# Patient Record
Sex: Female | Born: 1990 | Hispanic: No | Marital: Single | Smoking: Current every day smoker
Health system: Southern US, Community
[De-identification: ages and names within clinical notes are randomized; demographics above are authoritative.]

## PROBLEM LIST (undated history)

## (undated) DIAGNOSIS — K529 Noninfective gastroenteritis and colitis, unspecified: Secondary | ICD-10-CM

## (undated) HISTORY — PX: SPLENECTOMY, TOTAL: SHX788

---

## 2020-02-13 ENCOUNTER — Emergency Department (HOSPITAL_COMMUNITY)
Admission: EM | Admit: 2020-02-13 | Discharge: 2020-02-13 | Disposition: A | Discharge status: Home / Self Care | Payer: Medicaid Other | Attending: Emergency Medicine | Admitting: Emergency Medicine

## 2020-02-13 ENCOUNTER — Other Ambulatory Visit: Payer: Self-pay

## 2020-02-13 ENCOUNTER — Encounter (HOSPITAL_COMMUNITY): Payer: Self-pay | Admitting: Emergency Medicine

## 2020-02-13 DIAGNOSIS — R112 Nausea with vomiting, unspecified: Secondary | ICD-10-CM | POA: Insufficient documentation

## 2020-02-13 DIAGNOSIS — R197 Diarrhea, unspecified: Secondary | ICD-10-CM | POA: Insufficient documentation

## 2020-02-13 DIAGNOSIS — F17200 Nicotine dependence, unspecified, uncomplicated: Secondary | ICD-10-CM | POA: Insufficient documentation

## 2020-02-13 DIAGNOSIS — R509 Fever, unspecified: Secondary | ICD-10-CM | POA: Diagnosis not present

## 2020-02-13 DIAGNOSIS — R300 Dysuria: Secondary | ICD-10-CM | POA: Diagnosis present

## 2020-02-13 DIAGNOSIS — N898 Other specified noninflammatory disorders of vagina: Secondary | ICD-10-CM | POA: Diagnosis not present

## 2020-02-13 DIAGNOSIS — R109 Unspecified abdominal pain: Secondary | ICD-10-CM | POA: Insufficient documentation

## 2020-02-13 LAB — COMPREHENSIVE METABOLIC PANEL
ALT: 11 U/L (ref 0–44)
AST: 10 U/L — ABNORMAL LOW (ref 15–41)
Albumin: 4.2 g/dL (ref 3.5–5.0)
Alkaline Phosphatase: 77 U/L (ref 38–126)
Anion gap: 10 (ref 5–15)
BUN: 7 mg/dL (ref 6–20)
CO2: 24 mmol/L (ref 22–32)
Calcium: 9.5 mg/dL (ref 8.9–10.3)
Chloride: 106 mmol/L (ref 98–111)
Creatinine, Ser: 0.77 mg/dL (ref 0.44–1.00)
GFR calc Af Amer: >60 mL/min (ref >60)
GFR calc non Af Amer: >60 mL/min (ref >60)
Glucose, Bld: 88 mg/dL (ref 70–99)
Potassium: 3.8 mmol/L (ref 3.5–5.1)
Sodium: 140 mmol/L (ref 135–145)
Total Bilirubin: 0.5 mg/dL (ref 0.3–1.2)
Total Protein: 7.2 g/dL (ref 6.5–8.1)

## 2020-02-13 LAB — CBC
HCT: 46.3 % — ABNORMAL HIGH (ref 36.0–46.0)
Hemoglobin: 15.4 g/dL — ABNORMAL HIGH (ref 12.0–15.0)
MCH: 30.5 pg (ref 26.0–34.0)
MCHC: 33.3 g/dL (ref 30.0–36.0)
MCV: 91.7 fL (ref 80.0–100.0)
Platelets: 197 10*3/uL (ref 150–400)
RBC: 5.05 MIL/uL (ref 3.87–5.11)
RDW: 12.9 % (ref 11.5–15.5)
WBC: 8.5 10*3/uL (ref 4.0–10.5)
nRBC: 0 % (ref 0.0–0.2)

## 2020-02-13 LAB — LIPASE, BLOOD: Lipase: 17 U/L (ref 11–51)

## 2020-02-13 LAB — HIV ANTIBODY (ROUTINE TESTING W REFLEX): HIV Screen 4th Generation wRfx: NONREACTIVE

## 2020-02-13 MED ORDER — ONDANSETRON HCL 4 MG/2ML IJ SOLN
INTRAMUSCULAR | Status: AC
  Filled 2020-02-13: qty 2

## 2020-02-13 MED ORDER — SODIUM CHLORIDE 0.9 % IV BOLUS
1000.0000 mL | Freq: Once | INTRAVENOUS | Status: AC
  Administered 2020-02-13: 1000 mL via INTRAVENOUS

## 2020-02-13 MED ORDER — DICYCLOMINE HCL 10 MG/ML IM SOLN
20.0000 mg | Freq: Once | INTRAMUSCULAR | Status: DC
  Filled 2020-02-13: qty 2

## 2020-02-13 MED ORDER — KETOROLAC TROMETHAMINE 30 MG/ML IJ SOLN
30.0000 mg | Freq: Once | INTRAMUSCULAR | Status: AC
  Administered 2020-02-13: 30 mg via INTRAVENOUS
  Filled 2020-02-13: qty 1

## 2020-02-13 MED ORDER — METRONIDAZOLE 500 MG PO TABS
500.0000 mg | ORAL_TABLET | Freq: Two times a day (BID) | ORAL | 0 refills | Status: AC
Start: 2020-02-13 — End: 2020-02-20

## 2020-02-13 MED ORDER — SODIUM CHLORIDE 0.9% FLUSH: 3.0000 mL | Freq: Once | INTRAVENOUS | Status: DC

## 2020-02-13 MED ORDER — METOCLOPRAMIDE HCL 5 MG/ML IJ SOLN
10.0000 mg | Freq: Once | INTRAMUSCULAR | Status: AC
  Administered 2020-02-13: 10 mg via INTRAVENOUS
  Filled 2020-02-13: qty 2

## 2020-02-13 NOTE — ED Notes (Addendum)
PT screaming because "nothing is being done" for pt. Pt states "I would rather go lay in my bed and cry than be in here. They don't like me and that is why I can't get medicine." Pt asked to speak with "a real doctor". Notified Cortni, PA.   Pt removed IV and ran out of room cursing at staff stating "fuck this hospital". Family member asked for prescription to be called in for pt as pt left ER. Cortni, PA, aware.

## 2020-02-13 NOTE — ED Triage Notes (Signed)
Pt was told she has an STD and needs treatment also c/o of dysuria, n/v/d, diarrhea

## 2020-02-13 NOTE — ED Provider Notes (Cosign Needed)
[facility] EMERGENCY DEPARTMENT Provider Note   CSN: [id number] Arrival date & time: 02/13/20  1515     History Chief Complaint  Patient presents with  . Dysuria    Virginia Lynch is a 29 y.o. female.  HPI   30 year old female presenting for evaluation of left-sided abdominal pain.  States this has been ongoing for at least the last week.  She reports associated nausea, vomiting, diarrhea but states that she has been having black stools for several days.  She also reports subjective fevers at home, dysuria and vaginal discharge.  She states that she was diagnosed with trichomonas but has not been given a prescription for this so she came here for antibiotics.  She is also requesting repeat STD testing.    History reviewed. No pertinent past medical history.  There are no problems to display for this patient.   History reviewed. No pertinent surgical history.   OB History    Gravida  3   Para      Term      Preterm      AB      Living  3     SAB      TAB      Ectopic      Multiple      Live Births              No family history on file.  Social History   Tobacco Use  . Smoking status: Current Every Day Smoker    Packs/day: 1.00  . Smokeless tobacco: Never Used  Substance Use Topics  . Alcohol use: Never  . Drug use: Yes    Types: Marijuana    Home Medications Prior to Admission medications   Medication Sig Start Date End Date Taking? Authorizing Provider  metroNIDAZOLE (FLAGYL) 500 MG tablet Take 1 tablet (500 mg total) by mouth 2 (two) times daily for 7 days. 02/13/20 02/20/20  Dallys Nowakowski S, PA-C    Allergies    Patient has no allergy information on record.  Review of Systems   Review of Systems  Constitutional: Positive for fever.  HENT: Negative for ear pain and sore throat.   Eyes: Negative for visual disturbance.  Respiratory: Negative for cough and shortness of breath.   Cardiovascular: Negative for chest pain.    Gastrointestinal: Positive for abdominal pain, diarrhea, nausea and vomiting.  Genitourinary: Positive for dysuria, pelvic pain and vaginal discharge. Negative for hematuria.  Musculoskeletal: Negative for back pain.  Skin: Negative for rash.  Neurological: Negative for headaches.  All other systems reviewed and are negative.   Physical Exam Updated Vital Signs BP 128/85 (BP Location: Left Arm)   Pulse 70   Temp 98 F (36.7 C) (Oral)   Ht 5\' 7"  (1.702 m)   Wt 83.9 kg   SpO2 98%   BMI 28.98 kg/m   Physical Exam Vitals and nursing note reviewed.  Constitutional:      General: She is not in acute distress.    Appearance: She is well-developed.  HENT:     Head: Normocephalic and atraumatic.  Eyes:     Conjunctiva/sclera: Conjunctivae normal.  Cardiovascular:     Rate and Rhythm: Normal rate and regular rhythm.     Pulses: Normal pulses.     Heart sounds: Normal heart sounds. No murmur heard.   Pulmonary:     Effort: Pulmonary effort is normal. No respiratory distress.     Breath sounds: Normal breath sounds. No wheezing,  rhonchi or rales.  Abdominal:     General: Bowel sounds are normal.     Palpations: Abdomen is soft.     Tenderness: There is abdominal tenderness. There is no guarding or rebound.     Comments: LLQ TTP but distractible on exam  Genitourinary:    Comments: Pt eloped prior to pelvic exam Musculoskeletal:     Cervical back: Neck supple.  Skin:    General: Skin is warm and dry.  Neurological:     Mental Status: She is alert.     ED Results / Procedures / Treatments   Labs (all labs ordered are listed, but only abnormal results are displayed) Labs Reviewed  COMPREHENSIVE METABOLIC PANEL - Abnormal; Notable for the following components:      Result Value   AST 10 (*)    All other components within normal limits  CBC - Abnormal; Notable for the following components:   Hemoglobin 15.4 (*)    HCT 46.3 (*)    All other components within normal  limits  WET PREP, GENITAL  LIPASE, BLOOD  URINALYSIS, ROUTINE W REFLEX MICROSCOPIC  HIV ANTIBODY (ROUTINE TESTING W REFLEX)  RPR  GC/CHLAMYDIA PROBE AMP ([place]) NOT AT [facility]    EKG None  Radiology No results found.  Procedures Procedures (including critical care time)  Medications Ordered in ED Medications  sodium chloride flush (NS) 0.9 % injection 3 mL (0 mLs Intravenous Hold 02/13/20 1621)  ondansetron (ZOFRAN) 4 MG/2ML injection (has no administration in time range)  sodium chloride 0.9 % bolus 1,000 mL (1,000 mLs Intravenous New Bag/Given 02/13/20 1742)  ketorolac (TORADOL) 30 MG/ML injection 30 mg (30 mg Intravenous Given 02/13/20 1743)  metoCLOPramide (REGLAN) injection 10 mg (10 mg Intravenous Given 02/13/20 1743)  dicyclomine (BENTYL) injection 20 mg (20 mg Intramuscular Given 02/13/20 1743)    ED Course  I have reviewed the triage vital signs and the nursing notes.  Pertinent labs & imaging results that were available during my care of the patient were reviewed by me and considered in my medical decision making (see chart for details).    MDM Rules/Calculators/A&P                         29 year old female presenting for evaluation of nausea, vomiting, diarrhea, vaginal discharge and urinary symptoms.  Had positive test for trichomonas at urgent care several days ago.  She is here stating that she never received antibiotics and is requesting a prescription.  I initiated work-up with labs, UA, pregnancy test.  I discussed plan with patient to obtain labs, pelvic exam.  She is also requesting repeat STD testing.  I ordered IV fluids, Reglan, Toradol and Bentyl.  I reviewed records per care everywhere.  Patient was seen at an urgent care on 02/08/2020.  At that time she had STD testing and was noted to have trichomonas.  She was placed on doxycycline at that time.  It also appears that she was given an Rx for Flagyl on 02/12/2020.  She was seen again yesterday at [facility]  ED for evaluation of similar symptoms.  During this visit she had a very thorough work-up including complete labs, UA, CT abdomen/pelvis.  Results of CT abdomen/pelvis was notable for developing gastroenteritis, 2 cm right ovarian cyst, and bilateral pleural effusions.  Her UA showed leukocytes, 5-10 squamous cells, WBCs without any significant bacteria.  This suggests she may have STI rather than UTI.  Patient was advised to  continue taking her Flagyl and doxycycline and they also gave her Rx for Phenergan for home.  This note indicates that patient admitted to already starting the doxycycline and Flagyl.  Upon receiving Toradol, patient became very agitated and started raising her voice at staff stating that we do not like her which is why we did not give her narcotics.  She took out her IV and ambulated out of the department before I could have a discussion with her about leaving AMA.  Her mother is at bedside and is repeatedly asking for me to fill a prescription to treat her trichomonas.  I advised that it appeared patient had already received a prescription however mother is adamant that she has not received this.  I will call in Rx for Flagyl to treat her trichomonas.  Final Clinical Impression(s) / ED Diagnoses Final diagnoses:  Abdominal pain, unspecified abdominal location  Dysuria  Vaginal discharge    Rx / DC Orders ED Discharge Orders         Ordered    metroNIDAZOLE (FLAGYL) 500 MG tablet  2 times daily     Discontinue  Reprint     02/13/20 1759           Karrie Meres, PA-C 02/13/20 1808

## 2020-02-13 NOTE — ED Notes (Signed)
Pt refused treatment until mother allowed to be present in room, including pelvic exam. Mother accompanying pt in room at this time.  Per mother "The [place] ER gave her Keflex for the Trich and I know that is the wrong medication. She has been throwing up blood and peeing blood for a month. 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 Also she has an IUD that was supposed to come out in 2018 and she needs it out. The hospital put it in so y'all should be able to take it out".  Informed pt mother that the ER does not remove IUDs and advised to follow up with OBGYN for removal.

## 2020-02-14 LAB — SYPHILIS: RPR W/REFLEX TO RPR TITER AND TREPONEMAL ANTIBODIES, TRADITIONAL SCREENING AND DIAGNOSIS ALGORITHM: RPR Ser Ql: NONREACTIVE

## 2020-07-23 ENCOUNTER — Encounter (HOSPITAL_COMMUNITY): Payer: Self-pay

## 2020-07-23 ENCOUNTER — Observation Stay (HOSPITAL_COMMUNITY)
Admission: EM | Admit: 2020-07-23 | Discharge: 2020-07-23 | Disposition: A | Discharge status: Home / Self Care | Payer: Medicaid Other | Attending: Family Medicine | Admitting: Family Medicine

## 2020-07-23 ENCOUNTER — Other Ambulatory Visit: Payer: Self-pay

## 2020-07-23 ENCOUNTER — Emergency Department (HOSPITAL_COMMUNITY): Payer: Medicaid Other

## 2020-07-23 DIAGNOSIS — F12188 Cannabis abuse with other cannabis-induced disorder: Secondary | ICD-10-CM | POA: Diagnosis not present

## 2020-07-23 DIAGNOSIS — Z720 Tobacco use: Secondary | ICD-10-CM | POA: Diagnosis not present

## 2020-07-23 DIAGNOSIS — Z20822 Contact with and (suspected) exposure to covid-19: Secondary | ICD-10-CM | POA: Diagnosis not present

## 2020-07-23 DIAGNOSIS — F17210 Nicotine dependence, cigarettes, uncomplicated: Secondary | ICD-10-CM | POA: Diagnosis not present

## 2020-07-23 DIAGNOSIS — R1115 Cyclical vomiting syndrome unrelated to migraine: Secondary | ICD-10-CM | POA: Diagnosis not present

## 2020-07-23 DIAGNOSIS — R112 Nausea with vomiting, unspecified: Secondary | ICD-10-CM | POA: Diagnosis present

## 2020-07-23 DIAGNOSIS — Z79899 Other long term (current) drug therapy: Secondary | ICD-10-CM | POA: Insufficient documentation

## 2020-07-23 DIAGNOSIS — R1032 Left lower quadrant pain: Secondary | ICD-10-CM | POA: Diagnosis not present

## 2020-07-23 DIAGNOSIS — F1210 Cannabis abuse, uncomplicated: Secondary | ICD-10-CM | POA: Diagnosis not present

## 2020-07-23 HISTORY — DX: Noninfective gastroenteritis and colitis, unspecified: K52.9

## 2020-07-23 LAB — RAPID URINE DRUG SCREEN, HOSP PERFORMED
Amphetamines: NOT DETECTED
Barbiturates: NOT DETECTED
Benzodiazepines: NOT DETECTED
Cocaine: NOT DETECTED
Opiates: POSITIVE — AB
Tetrahydrocannabinol: POSITIVE — AB

## 2020-07-23 LAB — CBC
HCT: 44.8 % (ref 36.0–46.0)
Hemoglobin: 14.6 g/dL (ref 12.0–15.0)
MCH: 27.1 pg (ref 26.0–34.0)
MCHC: 32.6 g/dL (ref 30.0–36.0)
MCV: 83.1 fL (ref 80.0–100.0)
Platelets: 452 10*3/uL — ABNORMAL HIGH (ref 150–400)
RBC: 5.39 MIL/uL — ABNORMAL HIGH (ref 3.87–5.11)
RDW: 21.6 % — ABNORMAL HIGH (ref 11.5–15.5)
WBC: 21.9 10*3/uL — ABNORMAL HIGH (ref 4.0–10.5)
nRBC: 0 % (ref 0.0–0.2)

## 2020-07-23 LAB — URINALYSIS, ROUTINE W REFLEX MICROSCOPIC
Bilirubin Urine: NEGATIVE
Glucose, UA: NEGATIVE mg/dL
Ketones, ur: 5 mg/dL — AB
Leukocytes,Ua: NEGATIVE
Nitrite: NEGATIVE
Protein, ur: NEGATIVE mg/dL
Specific Gravity, Urine: 1.02 (ref 1.005–1.030)
pH: 6 (ref 5.0–8.0)

## 2020-07-23 LAB — COMPREHENSIVE METABOLIC PANEL
ALT: 12 U/L (ref 0–44)
AST: 10 U/L — ABNORMAL LOW (ref 15–41)
Albumin: 4 g/dL (ref 3.5–5.0)
Alkaline Phosphatase: 78 U/L (ref 38–126)
Anion gap: 12 (ref 5–15)
BUN: 12 mg/dL (ref 6–20)
CO2: 24 mmol/L (ref 22–32)
Calcium: 9.2 mg/dL (ref 8.9–10.3)
Chloride: 100 mmol/L (ref 98–111)
Creatinine, Ser: 0.73 mg/dL (ref 0.44–1.00)
GFR, Estimated: >60 mL/min (ref >60)
Glucose, Bld: 99 mg/dL (ref 70–99)
Potassium: 3.7 mmol/L (ref 3.5–5.1)
Sodium: 136 mmol/L (ref 135–145)
Total Bilirubin: 0.5 mg/dL (ref 0.3–1.2)
Total Protein: 7 g/dL (ref 6.5–8.1)

## 2020-07-23 LAB — POC URINE PREG, ED: Preg Test, Ur: NEGATIVE

## 2020-07-23 LAB — RESP PANEL BY RT-PCR (FLU A&B, COVID) ARPGX2
Influenza A by PCR: NEGATIVE
Influenza B by PCR: NEGATIVE
SARS Coronavirus 2 by RT PCR: NEGATIVE

## 2020-07-23 LAB — LIPASE, BLOOD: Lipase: 16 U/L (ref 11–51)

## 2020-07-23 MED ORDER — CAPSAICIN 0.025 % EX CREA
TOPICAL_CREAM | Freq: Two times a day (BID) | CUTANEOUS | Status: DC
  Filled 2020-07-23: qty 60

## 2020-07-23 MED ORDER — DEXTROSE-NACL 5-0.45 % IV SOLN: INTRAVENOUS | Status: DC

## 2020-07-23 MED ORDER — MORPHINE SULFATE (PF) 4 MG/ML IV SOLN
4.0000 mg | Freq: Once | INTRAVENOUS | Status: AC
  Administered 2020-07-23: 14:00:00 4 mg via INTRAVENOUS
  Filled 2020-07-23: qty 1

## 2020-07-23 MED ORDER — IOHEXOL 300 MG/ML  SOLN
100.0000 mL | Freq: Once | INTRAMUSCULAR | Status: AC | PRN
  Administered 2020-07-23: 15:00:00 100 mL via INTRAVENOUS

## 2020-07-23 MED ORDER — ONDANSETRON HCL 4 MG/2ML IJ SOLN
4.0000 mg | Freq: Once | INTRAMUSCULAR | Status: AC
  Administered 2020-07-23: 14:00:00 4 mg via INTRAVENOUS
  Filled 2020-07-23: qty 2

## 2020-07-23 MED ORDER — PROMETHAZINE HCL 25 MG/ML IJ SOLN
12.5000 mg | Freq: Once | INTRAMUSCULAR | Status: AC
  Administered 2020-07-23: 16:00:00 12.5 mg via INTRAVENOUS
  Filled 2020-07-23: qty 1

## 2020-07-23 MED ORDER — PANTOPRAZOLE SODIUM 40 MG IV SOLR
40.0000 mg | Freq: Once | INTRAVENOUS | Status: AC
  Administered 2020-07-23: 19:00:00 40 mg via INTRAVENOUS
  Filled 2020-07-23: qty 40

## 2020-07-23 MED ORDER — HYDROMORPHONE HCL 1 MG/ML IJ SOLN
1.0000 mg | Freq: Once | INTRAMUSCULAR | Status: AC
  Administered 2020-07-23: 16:00:00 1 mg via INTRAVENOUS
  Filled 2020-07-23: qty 1

## 2020-07-23 MED ORDER — SODIUM CHLORIDE 0.9 % IV BOLUS
1000.0000 mL | Freq: Once | INTRAVENOUS | Status: AC
  Administered 2020-07-23: 14:00:00 1000 mL via INTRAVENOUS

## 2020-07-23 MED ORDER — PANTOPRAZOLE SODIUM 20 MG PO TBEC: 20.0000 mg | DELAYED_RELEASE_TABLET | Freq: Every day | ORAL | 0 refills | Status: AC

## 2020-07-23 MED ORDER — SODIUM CHLORIDE 0.9 % IV BOLUS
1000.0000 mL | Freq: Once | INTRAVENOUS | Status: AC
  Administered 2020-07-23: 18:00:00 1000 mL via INTRAVENOUS

## 2020-07-23 NOTE — Consult Note (Signed)
Patient Demographics:    Virginia Lynch, is a 29 y.o. 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 ([place])   Marijuana abuse   Emesis, persistent   Tobacco abuse  1)Cannabis Hyperemesis Syndrome--- patient admits to smoking about $30-$40 worth of marijuana on a daily basis--- --patient was seen at [place] on 07/20/2020 with similar complaints, her work-up including CT abdomen and pelvis were unremarkable at that time -Patient history reveals persistent leukocytosis, WBC slightly higher than the usual baseline today--however chest x-ray, UA, and CT abdomen and pelvis without acute findings -Please note that patient has been afebrile for at least 72 hours or so -Out of abundance of caution blood cultures have been requested -I suspect patient's leukocytosis is reactive from excessive dry heaving/retching and emesis induced by cannabis hyperemesis syndrome -Given extensive work-up at [facility] health on 07/20/2020, and extensive work-up here today without significant acute findings patient will be discharged from the ED after IV fluids, PPI and antiemetics -Lipase is 16 =-Covid is negative, urine pregnancy test is negative, -UDS positive for opiates and THC -Please note that prior UDS done at [facility] health over the summer were repeatedly positive for opiates and THC as well  2)Tobacco and cannabis abuse------ abstinence strongly advised, patient not ready to quit either tobacco or cannabis at this time   Disposition--- okay to discharge home   With History of - Reviewed by me  Past Medical History:  Diagnosis  Date  . Colitis      Past Surgical History:  Procedure Laterality Date  . CESAREAN SECTION    . SPLENECTOMY, TOTAL      Chief Complaint  Patient presents with  . Emesis     HPI:    Virginia Lynch  is a 29 y.o. female who is a daily tobacco and marijuana user, status post prior splenectomy with history of prior colitis presents to the ED with almost a week of nausea vomiting and abdominal pain with occasional loose stools -Mostly dry heaving at this time, tried oral antiemetics at home with little success -Stools without blood or mucus -Emesis without blood or bile  --Review of care everywhere shows that patient was seen at [place] on 07/20/2020 with extensive work-up including UDS lab work and CT abdomen and pelvis which were largely unremarkable except for UDS showing THC and opiates -- patient admits to smoking about $30-$40 worth of marijuana on a daily basis--- --patient was seen at [place] on 07/20/2020 with similar complaints, her work-up including CT abdomen and pelvis were unremarkable at that time -Patient history reveals persistent leukocytosis, WBC slightly higher than the usual baseline today--however chest x-ray, UA, and CT abdomen and pelvis without acute findings -Please note that  patient has been afebrile for at least 72 hours or so -Out of abundance of caution blood cultures have been requested -I suspect patient's leukocytosis is reactive from excessive dry heaving/retching and emesis induced by cannabis hyperemesis syndrome -Given extensive work-up at [facility] on 07/20/2020, and extensive work-up here today without significant acute findings patient will be discharged from the ED after IV fluids, PPI and antiemetics -Lipase is 16 =-Covid is negative, urine pregnancy test is negative, -UDS positive for opiates and THC -Please note that prior UDS done at [facility] over the summer were repeatedly positive for opiates and THC as well -- ---LFTs  are not elevated, CMP essentially WNL, with a creatinine of 0.73 -CBC with a white count of 21K  -Chest x-ray without acute finding   Review of systems:    In addition to the HPI above,   A full Review of  Systems was done, all other systems reviewed are negative except as noted above in HPI , .    Social History:  Reviewed by me    Social History   Tobacco Use  . Smoking status: Current Every Day Smoker    Packs/day: 1.00  . Smokeless tobacco: Never Used  Substance Use Topics  . Alcohol use: Never       Family History :  Reviewed by me   History reviewed. No pertinent family history.   Home Medications:   Prior to Admission medications   Medication Sig Start Date End Date Taking? Authorizing Provider  dicyclomine (BENTYL) 10 MG capsule Take by mouth. 12/07/18  Yes [provider]  famotidine (PEPCID) 20 MG tablet Take by mouth. 07/20/20 07/30/20 Yes [provider]  magnesium hydroxide (MILK OF MAGNESIA) 400 MG/5ML suspension Take by mouth. 03/19/20  Yes [provider]  methocarbamol (ROBAXIN) 500 MG tablet Take by mouth. 03/09/20  Yes [provider]  metroNIDAZOLE (FLAGYL) 500 MG tablet Take by mouth. 02/12/20  Yes [provider]  naloxone (NARCAN) nasal spray 4 mg/0.1 mL Place into the nose. 03/19/20  Yes [provider]  nicotine (NICODERM CQ - DOSED IN MG/24 HOURS) 21 mg/24hr patch Place onto the skin. 03/19/20  Yes [provider]  ondansetron (ZOFRAN-ODT) 4 MG disintegrating tablet Take by mouth. 07/20/20  Yes [provider]  pantoprazole (PROTONIX) 20 MG tablet  10/24/19  Yes [provider]  albuterol (VENTOLIN HFA) 108 (90 Base) MCG/ACT inhaler every 6 (six) hours.    [provider]  calcium carbonate (OS-CAL) 1250 (500 Ca) MG chewable tablet Chew by mouth.    [provider]  gabapentin (NEURONTIN) 100 MG capsule Take by mouth.    [provider]   levonorgestrel (MIRENA) 20 MCG/24HR IUD by Intrauterine route.    [provider]  SUPREP BOWEL PREP KIT 17.5-3.13-1.6 GM/177ML SOLN Take by mouth. 02/27/20   [provider]     Allergies:     Allergies  Allergen Reactions  . Toradol [Ketorolac Tromethamine]      Physical Exam:   Vitals  Blood pressure 103/64, pulse 68, temperature 98.4 F (36.9 C), temperature source Oral, resp. rate 15, height _0  (1.727 m), weight 86.2 kg, SpO2 100 %.  Physical Examination: General appearance - alert, well appearing, and in no distress  Mental status - alert, oriented to person, place, and time,  Eyes - sclera anicteric Neck - supple, no JVD elevation , Chest - clear  to auscultation bilaterally, symmetrical air movement,  Heart - S1 and S2  normal, regular  Abdomen - soft, left upper quadrant and epigastric discomfort without rebound or guarding, nondistended, no CVA area tenderness Neurological - screening mental status exam normal, neck supple without rigidity, cranial nerves II through XII intact, DTR's normal and symmetric Extremities - no pedal edema noted, intact peripheral pulses  Skin - warm, dry     Data Review:    CBC Recent Labs  Lab 07/23/20 1104  WBC 21.9*  HGB 14.6  HCT 44.8  PLT 452*  MCV 83.1  MCH 27.1  MCHC 32.6  RDW 21.6*   ------------------------------------------------------------------------------------------------------------------  Chemistries  Recent Labs  Lab 07/23/20 1104  NA 136  K 3.7  CL 100  CO2 24  GLUCOSE 99  BUN 12  CREATININE 0.73  CALCIUM 9.2  AST 10*  ALT 12  ALKPHOS 78  BILITOT 0.5   ------------------------------------------------------------------------------------------------------------------ estimated creatinine clearance is 119.2 mL/min (by C-G formula based on SCr of 0.73 mg/dL). ------------------------------------------------------------------------------------------------------------------ No  results for input(s): TSH, T4TOTAL, T3FREE, THYROIDAB in the last 72 hours.  Invalid input(s): FREET3   Coagulation profile No results for input(s): INR, PROTIME in the last 168 hours. ------------------------------------------------------------------------------------------------------------------- No results for input(s): DDIMER in the last 72 hours. -------------------------------------------------------------------------------------------------------------------  Cardiac Enzymes No results for input(s): CKMB, TROPONINI, MYOGLOBIN in the last 168 hours.  Invalid input(s): CK ------------------------------------------------------------------------------------------------------------------ No results found for: BNP   ---------------------------------------------------------------------------------------------------------------  Urinalysis    Component Value Date/Time   COLORURINE YELLOW 07/23/2020 1257   APPEARANCEUR CLOUDY (A) 07/23/2020 1257   LABSPEC 1.020 07/23/2020 1257   PHURINE 6.0 07/23/2020 1257   GLUCOSEU NEGATIVE 07/23/2020 1257   HGBUR SMALL (A) 07/23/2020 1257   BILIRUBINUR NEGATIVE 07/23/2020 1257   KETONESUR 5 (A) 07/23/2020 1257   PROTEINUR NEGATIVE 07/23/2020 1257   NITRITE NEGATIVE 07/23/2020 1257   LEUKOCYTESUR NEGATIVE 07/23/2020 1257     Imaging Results:    DG Chest 2 View  Result Date: 07/23/2020 CLINICAL DATA:  Emesis since 07/17/2020, abdominal cramping, diarrhea EXAM: CHEST - 2 VIEW COMPARISON:  None. FINDINGS: The heart size and mediastinal contours are within normal limits. Both lungs are clear. The visualized skeletal structures are unremarkable. IMPRESSION: No active cardiopulmonary disease. Electronically Signed   By: Randa Ngo M.D.   On: 07/23/2020 16:10   CT Abdomen Pelvis W Contrast  Result Date: 07/23/2020 CLINICAL DATA:  Left upper quadrant abdominal pain, nausea/vomiting, diarrhea EXAM: CT ABDOMEN AND PELVIS WITH CONTRAST  TECHNIQUE: Multidetector CT imaging of the abdomen and pelvis was performed using the standard protocol following bolus administration of intravenous contrast. CONTRAST:  1104m OMNIPAQUE IOHEXOL 300 MG/ML  SOLN COMPARISON:  None. FINDINGS: Lower chest: Lung bases are clear. Hepatobiliary: Liver is within normal limits. Gallbladder is unremarkable. No intrahepatic or extrahepatic ductal dilatation. Pancreas: Within normal limits. Spleen: Surgically absent. Adrenals/Urinary Tract: Adrenal glands are within normal limits. Kidneys are within normal limits.  No hydronephrosis. Bladder is within normal limits. Stomach/Bowel: Stomach is within normal limits. No evidence of bowel obstruction. Normal appendix (coronal image 49). No colonic wall thickening or inflammatory changes. Left colon is decompressed. Vascular/Lymphatic: No evidence of abdominal aortic aneurysm. No suspicious abdominopelvic lymphadenopathy. Reproductive: Uterus is notable for an IUD in satisfactory position. Left ovary is within normal limits. Suspected right corpus luteum (series 2/image 35), physiologic. Other: No abdominopelvic ascites. Musculoskeletal: Very mild degenerative changes of the lower thoracic spine and at L5-S1. IMPRESSION: Status post splenectomy. IUD in satisfactory position. No CT findings to account for the patient's left upper quadrant abdominal pain. Electronically  Signed   By: Julian Hy M.D.   On: 07/23/2020 14:56   Radiological Exams on Admission: DG Chest 2 View  Result Date: 07/23/2020 CLINICAL DATA:  Emesis since 07/17/2020, abdominal cramping, diarrhea EXAM: CHEST - 2 VIEW COMPARISON:  None. FINDINGS: The heart size and mediastinal contours are within normal limits. Both lungs are clear. The visualized skeletal structures are unremarkable. IMPRESSION: No active cardiopulmonary disease. Electronically Signed   By: Randa Ngo M.D.   On: 07/23/2020 16:10   CT Abdomen Pelvis W Contrast  Result Date:  07/23/2020 CLINICAL DATA:  Left upper quadrant abdominal pain, nausea/vomiting, diarrhea EXAM: CT ABDOMEN AND PELVIS WITH CONTRAST TECHNIQUE: Multidetector CT imaging of the abdomen and pelvis was performed using the standard protocol following bolus administration of intravenous contrast. CONTRAST:  164m OMNIPAQUE IOHEXOL 300 MG/ML  SOLN COMPARISON:  None. FINDINGS: Lower chest: Lung bases are clear. Hepatobiliary: Liver is within normal limits. Gallbladder is unremarkable. No intrahepatic or extrahepatic ductal dilatation. Pancreas: Within normal limits. Spleen: Surgically absent. Adrenals/Urinary Tract: Adrenal glands are within normal limits. Kidneys are within normal limits.  No hydronephrosis. Bladder is within normal limits. Stomach/Bowel: Stomach is within normal limits. No evidence of bowel obstruction. Normal appendix (coronal image 49). No colonic wall thickening or inflammatory changes. Left colon is decompressed. Vascular/Lymphatic: No evidence of abdominal aortic aneurysm. No suspicious abdominopelvic lymphadenopathy. Reproductive: Uterus is notable for an IUD in satisfactory position. Left ovary is within normal limits. Suspected right corpus luteum (series 2/image 35), physiologic. Other: No abdominopelvic ascites. Musculoskeletal: Very mild degenerative changes of the lower thoracic spine and at L5-S1. IMPRESSION: Status post splenectomy. IUD in satisfactory position. No CT findings to account for the patient's left upper quadrant abdominal pain. Electronically Signed   By: SJulian HyM.D.   On: 07/23/2020 14:56   Family Communication: Admission, patients condition and plan of care including tests being ordered have been discussed with the patient  who indicate understanding and agree with the plan   Code Status - Full Code  Condition   stable  CRoxan HockeyM.D on 07/23/2020 at 7:44 PM Go to [email or web address] -  for contact info  [facility] - Office  [phone]

## 2020-07-23 NOTE — ED Triage Notes (Signed)
Pt presents to ED with emesis since 12/23. Pt reports abdominal cramping. Pt also reports diarrhea.

## 2020-07-23 NOTE — ED Notes (Signed)
POC preg negative.

## 2020-07-23 NOTE — ED Notes (Signed)
Reports unable to void at this time

## 2020-07-23 NOTE — ED Provider Notes (Signed)
[facility] EMERGENCY DEPARTMENT Provider Note   CSN: [id number] Arrival date & time: 07/23/20  7290     History Chief Complaint  Patient presents with  . Emesis    Virginia Lynch is a 29 y.o. female with past medical history of colitis.  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  HPI Patient presents to emergency department today with chief complaint of emesis x6 days.  She states her symptoms have progressively worsened.  She admits to throwing up every hour.  She states she has tried taking Phenergan and Zofran without symptom improvement. Unable to tolerate PO intake. She is endorsing left upper quadrant abdominal pain.  She states it radiates throughout her stomach when she vomits.  She describes pain as sharp and burning. She states her emesis is non bloody, states today it was yellow in color.  She is also endorsing diarrhea.  She states stool is yellow liquid.  She has had 3 episodes of diarrhea in the last 24 hours. She is also reporting tmax of 102 x 3 days ago as well as chills. She denies any alcohol consumption.  She admits to smoking marijuana on special occasions, last was 12/13. She admits to eating [employer] prior to symptom onset. No sick contacts or known covid exposures. She denies congestion, cough, shortness of breath, chest pain, gross hematuria, urinary frequency, pelvic pain, abnormal vaginal bleeding, vaginal discharge, back pain.  Past Medical History:  Diagnosis Date  . Colitis     There are no problems to display for this patient.   Past Surgical History:  Procedure Laterality Date  . CESAREAN SECTION    . SPLENECTOMY, TOTAL       OB History    Gravida  3   Para      Term      Preterm      AB      Living  3     SAB      IAB      Ectopic      Multiple      Live Births              No family history on file.  Social History   Tobacco Use  . Smoking status: Current  Every Day Smoker    Packs/day: 1.00  . Smokeless tobacco: Never Used  Substance Use Topics  . Alcohol use: Never  . Drug use: Yes    Types: Marijuana    Home Medications Prior to Admission medications   Medication Sig Start Date End Date Taking? Authorizing Provider  dicyclomine (BENTYL) 10 MG capsule Take by mouth. 12/07/18  Yes [provider]  famotidine (PEPCID) 20 MG tablet Take by mouth. 07/20/20 07/30/20 Yes [provider]  magnesium hydroxide (MILK OF MAGNESIA) 400 MG/5ML suspension Take by mouth. 03/19/20  Yes [provider]  methocarbamol (ROBAXIN) 500 MG tablet Take by mouth. 03/09/20  Yes [provider]  metroNIDAZOLE (FLAGYL) 500 MG tablet Take by mouth. 02/12/20  Yes [provider]  naloxone (NARCAN) nasal spray 4 mg/0.1 mL Place into the nose. 03/19/20  Yes [provider]  nicotine (NICODERM CQ - DOSED IN MG/24 HOURS) 21 mg/24hr patch Place onto the skin. 03/19/20  Yes [provider]  ondansetron (ZOFRAN-ODT) 4 MG disintegrating tablet Take by mouth. 07/20/20  Yes [provider]  pantoprazole (PROTONIX) 20 MG tablet  10/24/19  Yes [provider]  albuterol (VENTOLIN HFA) 108 (90 Base)  MCG/ACT inhaler every 6 (six) hours.    [provider]  calcium carbonate (OS-CAL) 1250 (500 Ca) MG chewable tablet Chew by mouth.    [provider]  gabapentin (NEURONTIN) 100 MG capsule Take by mouth.    [provider]  levonorgestrel (MIRENA) 20 MCG/24HR IUD by Intrauterine route.    [provider]  SUPREP BOWEL PREP KIT 17.5-3.13-1.6 GM/177ML SOLN Take by mouth. 02/27/20   [provider]    Allergies    Toradol [ketorolac tromethamine]  Review of Systems   Review of Systems All other systems are reviewed and are negative for acute change except as noted in the HPI.  Physical Exam Updated Vital Signs BP 128/86 (BP Location: Right Arm)   Pulse 60   Temp  98 F (36.7 C) (Oral)   Resp 18   Ht '5\' 8"'  (1.727 m)   Wt 86.2 kg   SpO2 100%   BMI 28.89 kg/m   Physical Exam Vitals and nursing note reviewed.  Constitutional:      General: She is not in acute distress.    Appearance: She is ill-appearing. She is not toxic-appearing.  HENT:     Head: Normocephalic and atraumatic.     Right Ear: Tympanic membrane and external ear normal.     Left Ear: Tympanic membrane and external ear normal.     Nose: Nose normal.     Mouth/Throat:     Mouth: Mucous membranes are moist.     Pharynx: Oropharynx is clear.  Eyes:     General: No scleral icterus.       Right eye: No discharge.        Left eye: No discharge.     Extraocular Movements: Extraocular movements intact.     Conjunctiva/sclera: Conjunctivae normal.     Pupils: Pupils are equal, round, and reactive to light.  Neck:     Vascular: No JVD.  Cardiovascular:     Rate and Rhythm: Normal rate and regular rhythm.     Pulses: Normal pulses.          Radial pulses are 2+ on the right side and 2+ on the left side.     Heart sounds: Normal heart sounds.  Pulmonary:     Comments: Lungs clear to auscultation in all fields. Symmetric chest rise. No wheezing, rales, or rhonchi. Abdominal:     Tenderness: There is abdominal tenderness in the right upper quadrant, epigastric area and left upper quadrant. There is no right CVA tenderness or left CVA tenderness.     Comments: Abdomen is soft, non-distended. No rigidity, no guarding. No peritoneal signs.  Musculoskeletal:        General: Normal range of motion.     Cervical back: Normal range of motion.  Skin:    General: Skin is warm and dry.     Capillary Refill: Capillary refill takes less than 2 seconds.     Comments: No track marks seen on extremities  Neurological:     Mental Status: She is oriented to person, place, and time.     GCS: GCS eye subscore is 4. GCS verbal subscore is 5. GCS motor subscore is 6.     Comments: Fluent speech, no  facial droop.  Psychiatric:        Behavior: Behavior normal.     ED Results / Procedures / Treatments   Labs (all labs ordered are listed, but only abnormal results are displayed) Labs Reviewed  COMPREHENSIVE METABOLIC PANEL -  Abnormal; Notable for the following components:      Result Value   AST 10 (*)    All other components within normal limits  CBC - Abnormal; Notable for the following components:   WBC 21.9 (*)    RBC 5.39 (*)    RDW 21.6 (*)    Platelets 452 (*)    All other components within normal limits  URINALYSIS, ROUTINE W REFLEX MICROSCOPIC - Abnormal; Notable for the following components:   APPearance CLOUDY (*)    Hgb urine dipstick SMALL (*)    Ketones, ur 5 (*)    Bacteria, UA RARE (*)    All other components within normal limits  RAPID URINE DRUG SCREEN, HOSP PERFORMED - Abnormal; Notable for the following components:   Opiates POSITIVE (*)    Tetrahydrocannabinol POSITIVE (*)    All other components within normal limits  RESP PANEL BY RT-PCR (FLU A&B, COVID) ARPGX2  LIPASE, BLOOD  POC URINE PREG, ED    EKG None  Radiology DG Chest 2 View  Result Date: 07/23/2020 CLINICAL DATA:  Emesis since 07/17/2020, abdominal cramping, diarrhea EXAM: CHEST - 2 VIEW COMPARISON:  None. FINDINGS: The heart size and mediastinal contours are within normal limits. Both lungs are clear. The visualized skeletal structures are unremarkable. IMPRESSION: No active cardiopulmonary disease. Electronically Signed   By: Randa Ngo M.D.   On: 07/23/2020 16:10   CT Abdomen Pelvis W Contrast  Result Date: 07/23/2020 CLINICAL DATA:  Left upper quadrant abdominal pain, nausea/vomiting, diarrhea EXAM: CT ABDOMEN AND PELVIS WITH CONTRAST TECHNIQUE: Multidetector CT imaging of the abdomen and pelvis was performed using the standard protocol following bolus administration of intravenous contrast. CONTRAST:  149m OMNIPAQUE IOHEXOL 300 MG/ML  SOLN COMPARISON:  None. FINDINGS: Lower  chest: Lung bases are clear. Hepatobiliary: Liver is within normal limits. Gallbladder is unremarkable. No intrahepatic or extrahepatic ductal dilatation. Pancreas: Within normal limits. Spleen: Surgically absent. Adrenals/Urinary Tract: Adrenal glands are within normal limits. Kidneys are within normal limits.  No hydronephrosis. Bladder is within normal limits. Stomach/Bowel: Stomach is within normal limits. No evidence of bowel obstruction. Normal appendix (coronal image 49). No colonic wall thickening or inflammatory changes. Left colon is decompressed. Vascular/Lymphatic: No evidence of abdominal aortic aneurysm. No suspicious abdominopelvic lymphadenopathy. Reproductive: Uterus is notable for an IUD in satisfactory position. Left ovary is within normal limits. Suspected right corpus luteum (series 2/image 35), physiologic. Other: No abdominopelvic ascites. Musculoskeletal: Very mild degenerative changes of the lower thoracic spine and at L5-S1. IMPRESSION: Status post splenectomy. IUD in satisfactory position. No CT findings to account for the patient's left upper quadrant abdominal pain. Electronically Signed   By: SJulian HyM.D.   On: 07/23/2020 14:56    Procedures Procedures (including critical care time)  Medications Ordered in ED Medications  sodium chloride 0.9 % bolus 1,000 mL (has no administration in time range)  sodium chloride 0.9 % bolus 1,000 mL (0 mLs Intravenous Stopped 07/23/20 1534)  ondansetron (ZOFRAN) injection 4 mg (4 mg Intravenous Given 07/23/20 1335)  morphine 4 MG/ML injection 4 mg (4 mg Intravenous Given 07/23/20 1334)  iohexol (OMNIPAQUE) 300 MG/ML solution 100 mL (100 mLs Intravenous Contrast Given 07/23/20 1434)  HYDROmorphone (DILAUDID) injection 1 mg (1 mg Intravenous Given 07/23/20 1541)  promethazine (PHENERGAN) injection 12.5 mg (12.5 mg Intravenous Given 07/23/20 1538)    ED Course  I have reviewed the triage vital signs and the nursing  notes.  Pertinent labs & imaging results that were available  during my care of the patient were reviewed by me and considered in my medical decision making (see chart for details).    MDM Rules/Calculators/A&P                          History provided by patient with additional history obtained from chart review.  Patient had visit at outside ED for similar symptoms x3 days ago. Chart reviewed.  Patient presents to the ED with complaints of emesis, abdominal pain, diarrhea. Patient is ill-appearing however does not look toxic, in no apparent distress, vitals WNL. On exam patient tender to epigastric area as well as right and left upper quadrants, no peritoneal signs.  She looks dehydrated, mucous membranes are dry. Analgesics, anti-emetics, and fluids administered.   Labs are significant for leukocytosis of 21.9, no anemia, thrombocytosis with platelet count of 452.  CMP unremarkable.  Lipase within normal range.  UA negative for infection, does have 5 ketones suggestive of dehydration.  UDS positive for opiates and tetrahydrocannabinol.  Pregnancy test is negative, results not crossing over into the chart.  Considered CT AP given tenderness and leukocytosis with reported fever at home. Discussed risk and benefit of having repeat CT scan given she had one recently and patient wishes to proceed with imaging.  CT AP without acute findings. No signs of colitis, no SBO, or acute findings.  covid and influenza tests are negative. Discussed results with patient. She is still having abdominal pain and nausea. Patient given a second dose of analgesics and antiemetics. Given her leukocytosis and history of splenectomy chest xray obtained to rule out pneumonia. I viewed pt's chest xray and it does not suggest acute infectious processes. Emesis could be related to hyperemesis cannabinol syndrome. PO challenge unsuccessful. As patient cannot tolerate PO intake and pain has only minimally improved will consult  hospitalist for admission. Findings and plan of care discussed with supervising physician Dr. Noel Christmas hospitalist Dr. Maurene Capes with hospitalist service who evaluated patient and had lengthy discussion of symptoms and treatment. Please see his consult note. They came to agreement patient does not need admission for symptoms. Patient given IV protonix. Blood cultures will be drawn to r/o underlying infection.  Patient given second liter of IVF and is tolerating PO intake, Will dc with prescription for protonix and recommend pcp f/u if symptoms persist. She already has prescription for zofran and phenergan at home. Patient agreeable with plan of care. Discharged in stable condition.   Portions of this note were generated with [employer]. Dictation errors may occur despite best attempts at proofreading.    Final Clinical Impression(s) / ED Diagnoses Final diagnoses:  Nausea vomiting and diarrhea  Cannabis hyperemesis syndrome concurrent with and due to cannabis abuse [facility])    Rx / DC Orders ED Discharge Orders         Ordered    pantoprazole (PROTONIX) 20 MG tablet  Daily        07/23/20 1954           Lewanda Rife 07/23/20 2011    Truddie Hidden, MD 07/24/20 [phone]

## 2020-07-23 NOTE — Discharge Instructions (Addendum)
Prescription printed for protonox. This is a medicine to help with the abdominal pain because of your persistent emesis. Take as prescribed.  If you stop smoking marijuana your pain and vomiting will improve and likely go away.  Follow up with primary care doctor.

## 2020-07-28 LAB — CULTURE, BLOOD (ROUTINE X 2)
Culture: NO GROWTH
Culture: NO GROWTH
Special Requests: ADEQUATE
Special Requests: ADEQUATE

## 2021-06-12 IMAGING — CT CT ABD-PELV W/ CM
2 of 4 series · 16 of 46 positions shown, 18 images · IV contrast (omnipaque)
Comparison: None.

CLINICAL DATA: Left upper quadrant abdominal pain, nausea/vomiting,
diarrhea

EXAM:
CT ABDOMEN AND PELVIS WITH CONTRAST
TECHNIQUE: Multidetector CT imaging of the abdomen and pelvis was performed
using the standard protocol following bolus administration of
intravenous contrast.
CONTRAST:  100mL OMNIPAQUE IOHEXOL 300 MG/ML  SOLN

[Series 2: axial st · axial · 0.68mm/px · z∈[-366,+84]mm · 13 of 98 slices shown, 15 images]
[im 4/98  soft-tissue]
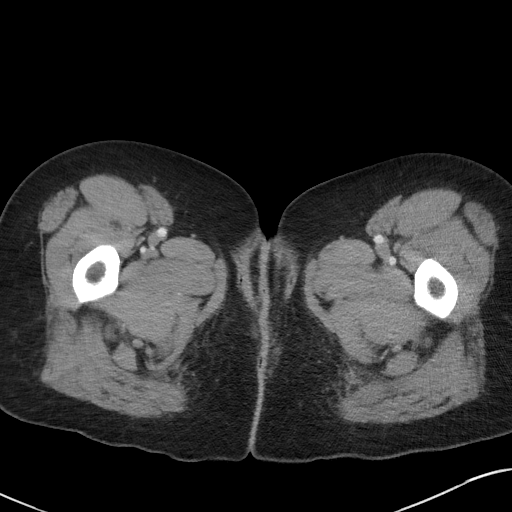
[im 4/98  bone]
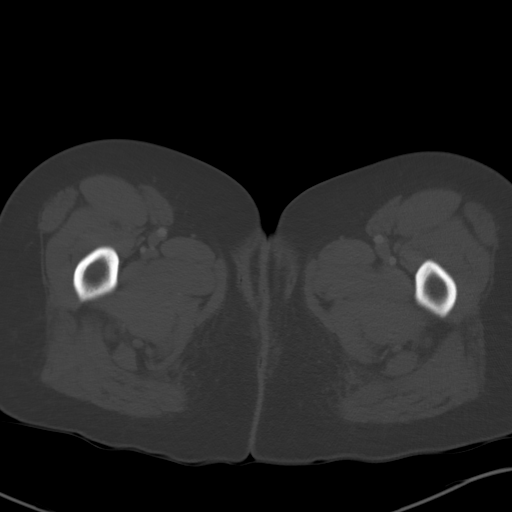
[im 12/98  soft-tissue]
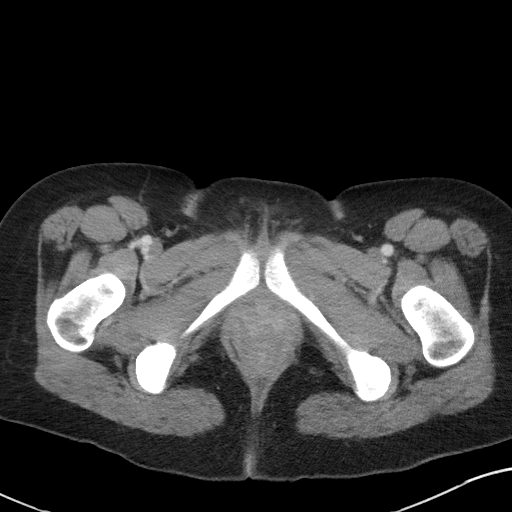
[im 20/98  soft-tissue]
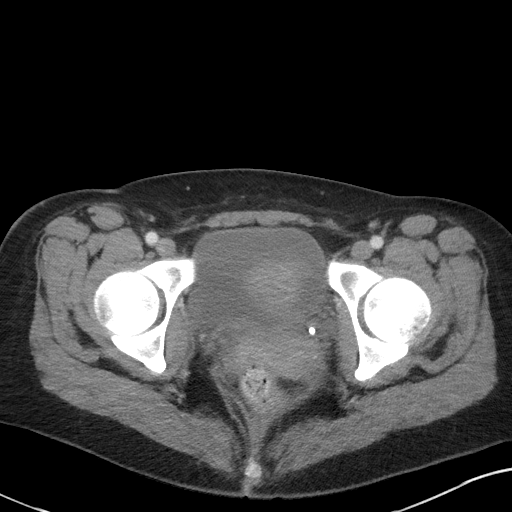
[im 28/98  soft-tissue]
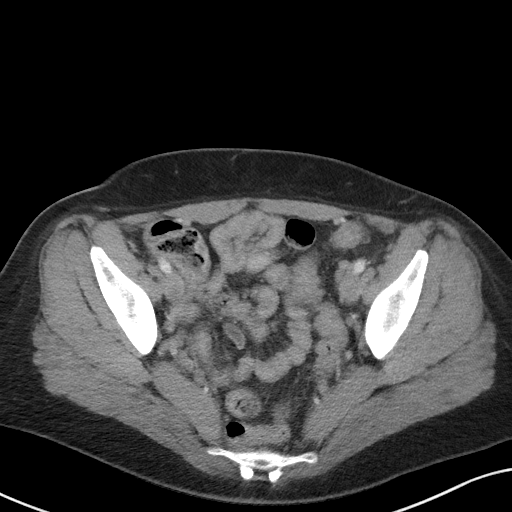
[im 35/98  soft-tissue]
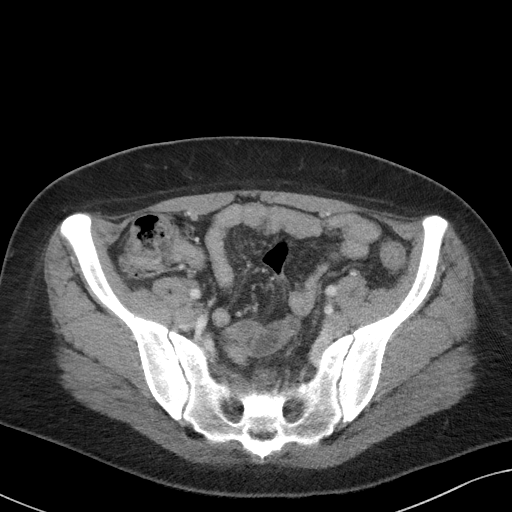
[im 43/98  soft-tissue]
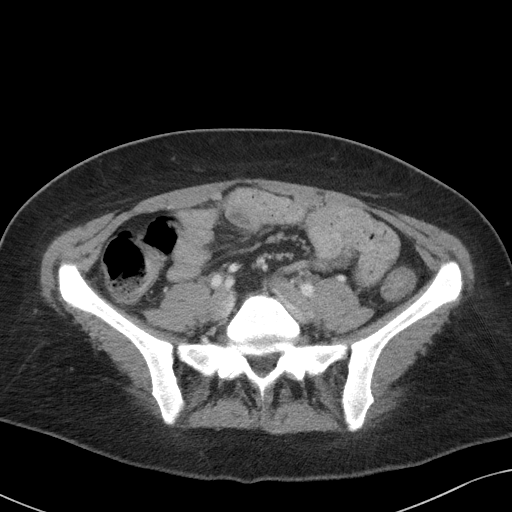
[im 51/98  soft-tissue]
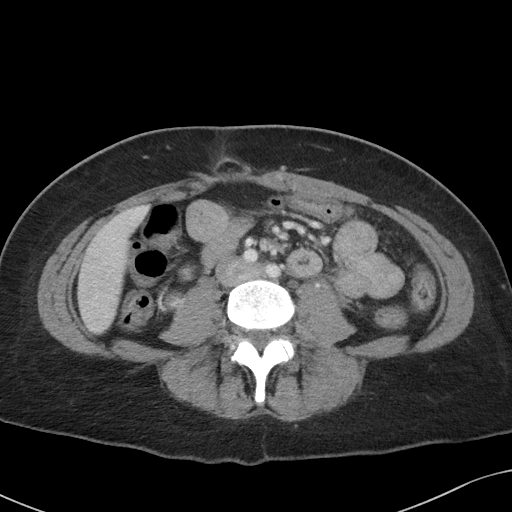
[im 55/98  soft-tissue]
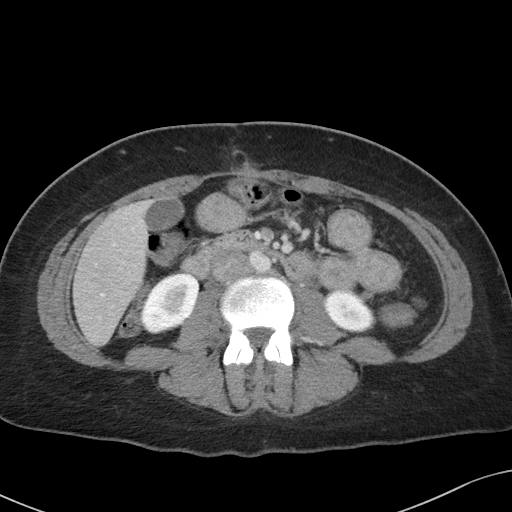
[im 63/98  soft-tissue]
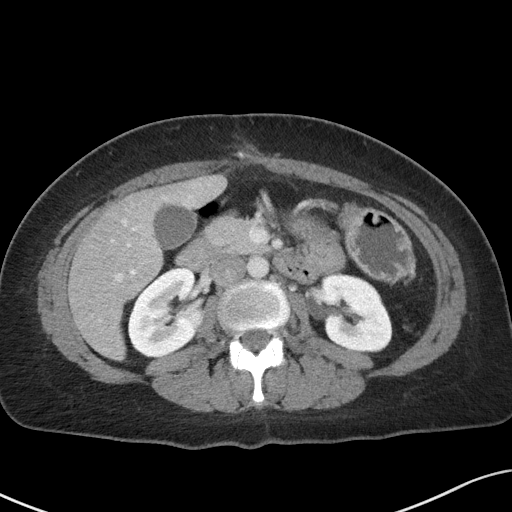
[im 63/98  bone]
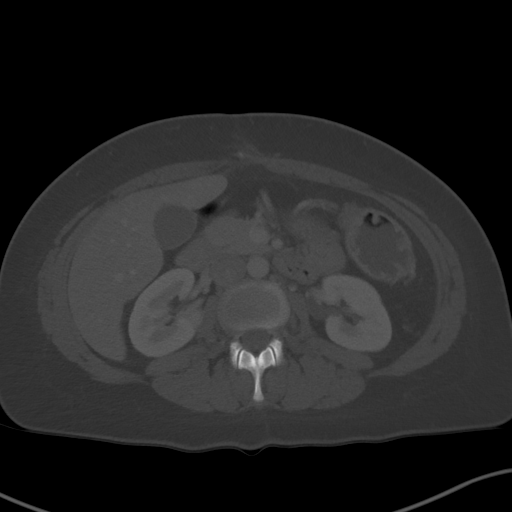
[im 70/98  soft-tissue]
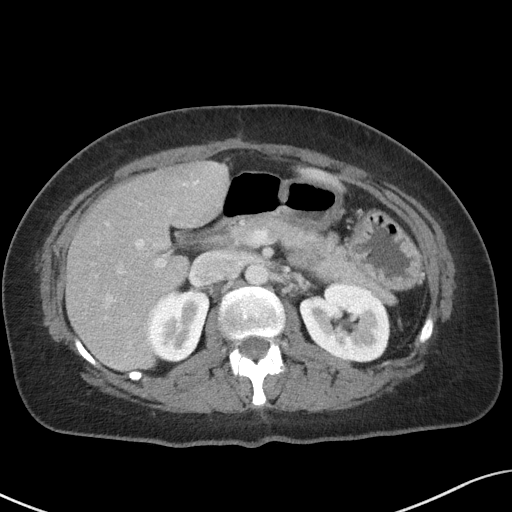
[im 78/98  soft-tissue]
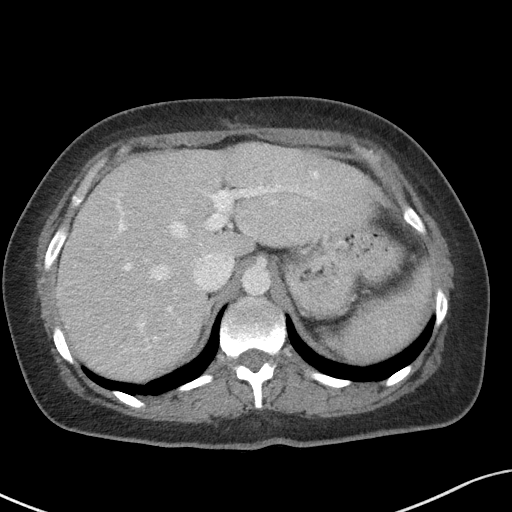
[im 86/98  soft-tissue]
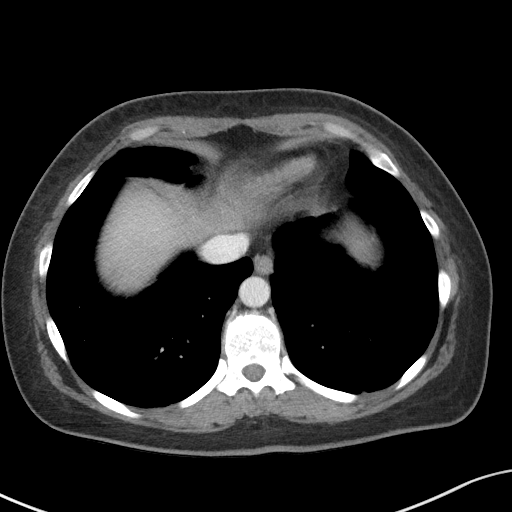
[im 94/98  soft-tissue]
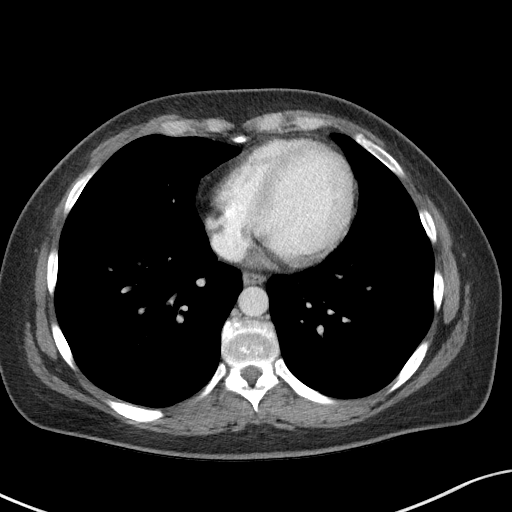

[Series 5: coronal st · coronal · 0.66mm/px · 3 of 86 slices shown]
[im 29/86  soft-tissue]
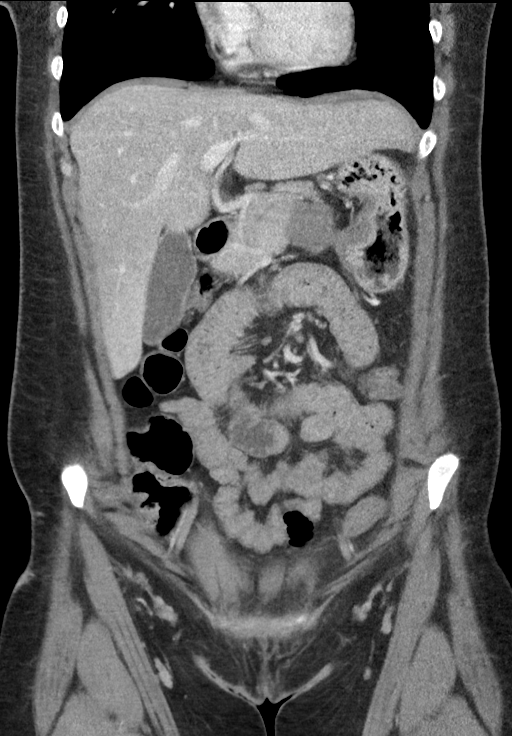
[im 38/86  soft-tissue]
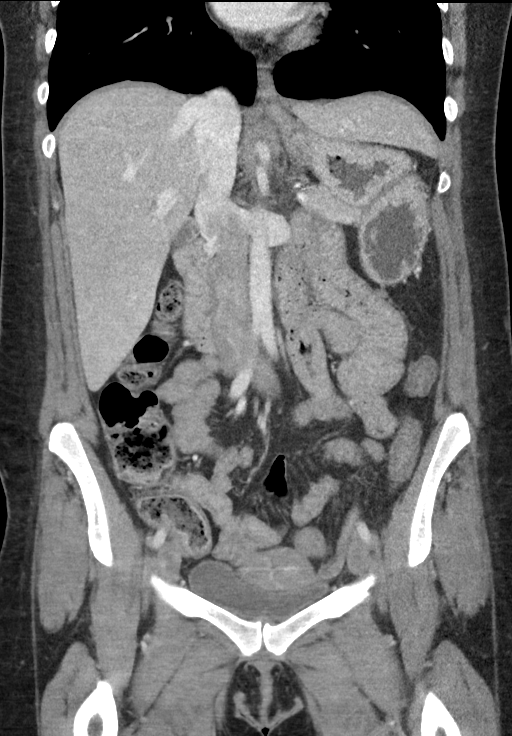
[im 48/86  soft-tissue]
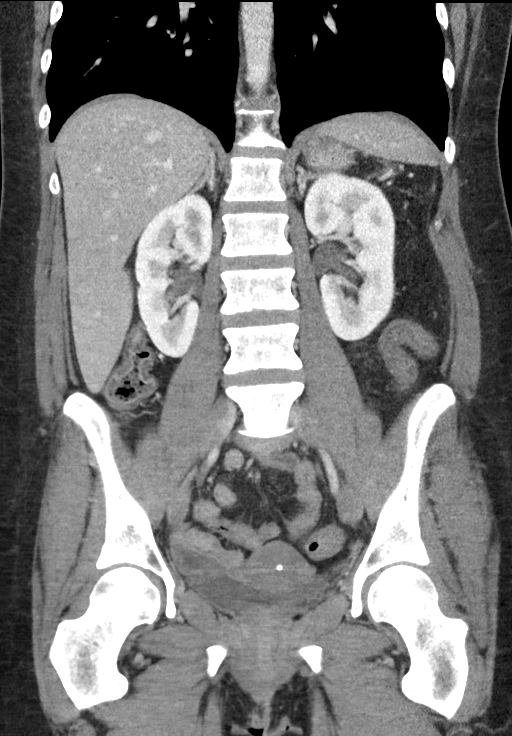

[16 of 46 positions shown; findings below may reference images not displayed]

FINDINGS: Lower chest: Lung bases are clear.

Hepatobiliary: Liver is within normal limits.

Gallbladder is unremarkable. No intrahepatic or extrahepatic ductal
dilatation.

Pancreas: Within normal limits.

Spleen: Surgically absent.

Adrenals/Urinary Tract: Adrenal glands are within normal limits.

Kidneys are within normal limits.  No hydronephrosis.

Bladder is within normal limits.

Stomach/Bowel: Stomach is within normal limits.

No evidence of bowel obstruction.

Normal appendix (coronal image 49).

No colonic wall thickening or inflammatory changes. Left colon is
decompressed.

Vascular/Lymphatic: No evidence of abdominal aortic aneurysm.

No suspicious abdominopelvic lymphadenopathy.

Reproductive: Uterus is notable for an IUD in satisfactory position.

Left ovary is within normal limits. Suspected right corpus luteum
(series 2/image 35), physiologic.

Other: No abdominopelvic ascites.

Musculoskeletal: Very mild degenerative changes of the lower
thoracic spine and at L5-S1.
IMPRESSION: Status post splenectomy.

IUD in satisfactory position.

No CT findings to account for the patient's left upper quadrant
abdominal pain.
